# Patient Record
Sex: Male | Born: 1965 | Hispanic: Yes | Marital: Married | Smoking: Never smoker
Health system: Southern US, Community
[De-identification: ages and names within clinical notes are randomized; demographics above are authoritative.]

## PROBLEM LIST (undated history)

## (undated) DIAGNOSIS — F32A Depression, unspecified: Secondary | ICD-10-CM

## (undated) DIAGNOSIS — F329 Major depressive disorder, single episode, unspecified: Secondary | ICD-10-CM

---
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
## 2005-07-19 ENCOUNTER — Emergency Department: Payer: Self-pay | Admitting: Emergency Medicine

## 2007-12-26 ENCOUNTER — Emergency Department: Payer: Self-pay | Admitting: Emergency Medicine

## 2013-09-17 ENCOUNTER — Emergency Department: Payer: Self-pay | Admitting: Internal Medicine

## 2019-08-31 ENCOUNTER — Emergency Department: Payer: Self-pay

## 2019-08-31 ENCOUNTER — Other Ambulatory Visit: Payer: Self-pay

## 2019-08-31 ENCOUNTER — Encounter: Payer: Self-pay | Admitting: Emergency Medicine

## 2019-08-31 ENCOUNTER — Emergency Department
Admission: EM | Admit: 2019-08-31 | Discharge: 2019-08-31 | Disposition: A | Discharge status: Home / Self Care | Payer: Self-pay | Attending: Emergency Medicine | Admitting: Emergency Medicine

## 2019-08-31 DIAGNOSIS — Z20822 Contact with and (suspected) exposure to covid-19: Secondary | ICD-10-CM | POA: Insufficient documentation

## 2019-08-31 DIAGNOSIS — J189 Pneumonia, unspecified organism: Secondary | ICD-10-CM | POA: Insufficient documentation

## 2019-08-31 DIAGNOSIS — R042 Hemoptysis: Secondary | ICD-10-CM

## 2019-08-31 HISTORY — DX: Depression, unspecified: F32.A

## 2019-08-31 LAB — CBC
HCT: 46.3 % (ref 39.0–52.0)
Hemoglobin: 15.5 g/dL (ref 13.0–17.0)
MCH: 29.4 pg (ref 26.0–34.0)
MCHC: 33.5 g/dL (ref 30.0–36.0)
MCV: 87.7 fL (ref 80.0–100.0)
Platelets: 209 10*3/uL (ref 150–400)
RBC: 5.28 MIL/uL (ref 4.22–5.81)
RDW: 13.5 % (ref 11.5–15.5)
WBC: 11.7 10*3/uL — ABNORMAL HIGH (ref 4.0–10.5)
nRBC: 0 % (ref 0.0–0.2)

## 2019-08-31 LAB — BASIC METABOLIC PANEL
Anion gap: 11 (ref 5–15)
BUN: 16 mg/dL (ref 6–20)
CO2: 22 mmol/L (ref 22–32)
Calcium: 8.8 mg/dL — ABNORMAL LOW (ref 8.9–10.3)
Chloride: 103 mmol/L (ref 98–111)
Creatinine, Ser: 0.71 mg/dL (ref 0.61–1.24)
GFR calc Af Amer: >60 mL/min (ref >60)
GFR calc non Af Amer: >60 mL/min (ref >60)
Glucose, Bld: 129 mg/dL — ABNORMAL HIGH (ref 70–99)
Potassium: 4.1 mmol/L (ref 3.5–5.1)
Sodium: 136 mmol/L (ref 135–145)

## 2019-08-31 LAB — PROTIME-INR
INR: 1 (ref 0.8–1.2)
Prothrombin Time: 12.7 seconds (ref 11.4–15.2)

## 2019-08-31 LAB — POC SARS CORONAVIRUS 2 AG: SARS Coronavirus 2 Ag: NEGATIVE

## 2019-08-31 MED ORDER — LEVOFLOXACIN 750 MG PO TABS: 750.0000 mg | ORAL_TABLET | Freq: Every day | ORAL | 0 refills | Status: AC

## 2019-08-31 MED ORDER — LEVOFLOXACIN 750 MG PO TABS
750.0000 mg | ORAL_TABLET | Freq: Once | ORAL | Status: AC
  Administered 2019-08-31: 750 mg via ORAL
  Filled 2019-08-31: qty 1

## 2019-08-31 MED ORDER — IOHEXOL 350 MG/ML SOLN
75.0000 mL | Freq: Once | INTRAVENOUS | Status: AC | PRN
  Administered 2019-08-31: 75 mL via INTRAVENOUS

## 2019-08-31 MED ORDER — PREDNISONE 20 MG PO TABS
60.0000 mg | ORAL_TABLET | Freq: Once | ORAL | Status: AC
  Administered 2019-08-31: 60 mg via ORAL
  Filled 2019-08-31: qty 3

## 2019-08-31 MED ORDER — METHYLPREDNISOLONE 4 MG PO TBPK: ORAL_TABLET | ORAL | 0 refills | Status: AC

## 2019-08-31 NOTE — ED Provider Notes (Signed)
[facility] Emergency Department Provider Note       Time seen: ----------------------------------------- 10:39 AM on 08/31/2019 -----------------------------------------   I have reviewed the triage vital signs and the nursing notes.  HISTORY   Chief Complaint Hemoptysis    HPI Evan Drake is a 54 y.o. male with a history of depression and hyperlipidemia who presents to the ED for sudden onset of hemoptysis.  Patient reports that started acutely this morning at 2 AM and recurred again at 9 AM.  He reports having some phlegm with obvious blood in it.  He has not had any recent sickness, denies any other complaints or pain.  Past Medical History:  Diagnosis Date  . Depression     There are no problems to display for this patient.   History reviewed. No pertinent surgical history.  Allergies Patient has no known allergies.  Social History Social History   Tobacco Use  . Smoking status: Never Smoker  . Smokeless tobacco: Never Used  Substance Use Topics  . Alcohol use: Never  . Drug use: Never    Review of Systems Constitutional: Negative for fever. Cardiovascular: Negative for chest pain. Respiratory: Negative for shortness of breath.  Positive for hemoptysis Gastrointestinal: Negative for abdominal pain, vomiting and diarrhea. Musculoskeletal: Negative for back pain. Skin: Negative for rash. Neurological: Negative for headaches, focal weakness or numbness.  All systems negative/normal/unremarkable except as stated in the HPI  ____________________________________________   PHYSICAL EXAM:  VITAL SIGNS: ED Triage Vitals  Enc Vitals Group     BP 08/31/19 0927 (!) 141/84     Pulse Rate 08/31/19 0927 93     Resp 08/31/19 0927 16     Temp 08/31/19 0927 99.4 F (37.4 C)     Temp Source 08/31/19 0927 Oral     SpO2 08/31/19 0927 98 %     Weight 08/31/19 0928 195 lb (88.5 kg)     Height 08/31/19 0928 5' 8.9" (1.75 m)   Head Circumference --      Peak Flow --      Pain Score 08/31/19 0927 0     Pain Loc --      Pain Edu? --      Excl. in GC? --     Constitutional: Alert and oriented. Well appearing and in no distress. Eyes: Conjunctivae are normal. Normal extraocular movements. ENT      Head: Normocephalic and atraumatic.      Nose: No congestion/rhinnorhea.      Mouth/Throat: Mucous membranes are moist.      Neck: No stridor. Cardiovascular: Normal rate, regular rhythm. No murmurs, rubs, or gallops. Respiratory: Normal respiratory effort without tachypnea nor retractions. Breath sounds are clear and equal bilaterally. No wheezes/rales/rhonchi. Gastrointestinal: Soft and nontender. Normal bowel sounds Musculoskeletal: Nontender with normal range of motion in extremities. No lower extremity tenderness nor edema. Neurologic:  Normal speech and language. No gross focal neurologic deficits are appreciated.  Skin:  Skin is warm, dry and intact. No rash noted. Psychiatric: Mood and affect are normal. Speech and behavior are normal.  ____________________________________________  ED COURSE:  As part of my medical decision making, I reviewed the following data within the electronic MEDICAL RECORD NUMBER History obtained from family if available, nursing notes, old chart and ekg, as well as notes from prior ED visits. Patient presented for hemoptysis, we will assess with labs and imaging as indicated at this time.   Procedures  Evan Drake was evaluated in Emergency Department  on 08/31/2019 for the symptoms described in the history of present illness. He was evaluated in the context of the global COVID-19 pandemic, which necessitated consideration that the patient might be at risk for infection with the SARS-CoV-2 virus that causes COVID-19. Institutional protocols and algorithms that pertain to the evaluation of patients at risk for COVID-19 are in a state of rapid change based on information released by  regulatory bodies including the CDC and federal and state organizations. These policies and algorithms were followed during the patient's care in the ED.  ____________________________________________   LABS (pertinent positives/negatives)  Labs Reviewed  CBC - Abnormal; Notable for the following components:      Result Value   WBC 11.7 (*)    All other components within normal limits  BASIC METABOLIC PANEL - Abnormal; Notable for the following components:   Glucose, Bld 129 (*)    Calcium 8.8 (*)    All other components within normal limits  PROTIME-INR  POC SARS CORONAVIRUS 2 AG  POC SARS CORONAVIRUS 2 AG -  ED    RADIOLOGY Images were viewed by me  Chest x-ray IMPRESSION: Subtle, streaky opacities at the right lung base, may represent atelectasis or early infiltrate. IMPRESSION: 1. Evaluation for pulmonary embolism is significantly limited by breath motion artifact, particularly in the bilateral lung bases. Within this limitation, there is no central or lobar pulmonary embolism. The segmental and more distal vessels cannot be adequately evaluated to exclude pulmonary embolism, particularly in the lung bases. 2. Dense, rounded subpleural consolidation in the right costophrenic recess, which is generally consistent with nonspecific infection or aspiration, although this appearance is also compatible with a subsegmental pulmonary infarction given reported history of hemoptysis. Consider technical repeat CT angiogram to further evaluate for pulmonary embolus if there is high clinical concern based on presentation. 3. Malignancy is likewise not strictly excluded. Recommend follow-up CT in 6-8 weeks to assess for resolution. Given location and subtlety on same day radiographs, radiographic assessment for resolution will likely prove insufficient.  ____________________________________________   DIFFERENTIAL DIAGNOSIS   Coagulopathy, thrombocytopenia, lung cancer, upper  GI bleeding,  FINAL ASSESSMENT AND PLAN  Hemoptysis, community-acquired pneumonia   Plan: The patient had presented for hemoptysis. Patient's labs do not reveal any acute process. Patient's imaging revealed a possible right lung base atelectasis or infiltrate.  CT is more indicative of pneumonia than any other etiology.  I had this reviewed by pulmonology, Dr. Raul Del who feels he can be safely discharged with Levaquin and Medrol Dosepak.  He will follow up with pulmonology as an outpatient.  He will also need repeat CT chest in 6 weeks.   Laurence Aly, MD    Note: This note was generated in part or whole with voice recognition software. Voice recognition is usually quite accurate but there are transcription errors that can and very often do occur. I apologize for any typographical errors that were not detected and corrected.     Earleen Newport, MD 08/31/19 [phone]

## 2019-08-31 NOTE — ED Triage Notes (Addendum)
Pt in via POV, reports hemoptysis since 0200 this morning, denies hx of same, denies blood thinners.  Vitals WDL, NAD noted at this time.

## 2020-05-22 ENCOUNTER — Telehealth: Payer: Self-pay

## 2020-05-22 DIAGNOSIS — Z87891 Personal history of nicotine dependence: Secondary | ICD-10-CM

## 2020-05-22 DIAGNOSIS — Z122 Encounter for screening for malignant neoplasm of respiratory organs: Secondary | ICD-10-CM

## 2020-05-22 NOTE — Telephone Encounter (Signed)
Received referral for initial lung cancer screening scan. Contacted patient and gave him information on what program consists of. Patient is agreeable to proceed with yearly CT scans. Smoking history obtained. Patient is a former smoker and quit smoking in March 2021.  He smoked about 2 packs a day for approximately 45 years. Patient denies any terminal illness/ comorbidities that would prevent curative treatment if lung cancer were found. Patient denies any cough, shortness of breath or any other lung issues. Patient request that the scan be scheduled on a Wednesday afternoon (after 3pm), due to his work schedule.  He wil wait for a call back with appt details.

## 2020-05-23 NOTE — Telephone Encounter (Signed)
Former smoker, quit 08/23/19, 90 pack year history.

## 2020-05-23 NOTE — Telephone Encounter (Signed)
Patient scheduled for CT scan on Wednesday 06/07/20 with arrival time of 415 pm at [facility]. Called pt and notified him of appt and also told him that he will be have a "shared decision making visit" with the asistance of an interpreter prior to CT. Patient is ok with this date and time. I will send him an appt reminder letter.

## 2020-05-23 NOTE — Addendum Note (Signed)
Addended by: Jonne Ply on: 05/23/2020 09:55 AM   Modules accepted: Orders

## 2020-06-07 ENCOUNTER — Other Ambulatory Visit: Payer: Self-pay

## 2020-06-07 ENCOUNTER — Inpatient Hospital Stay: Payer: Self-pay | Attending: Oncology | Admitting: Oncology

## 2020-06-07 ENCOUNTER — Ambulatory Visit
Admission: RE | Admit: 2020-06-07 | Discharge: 2020-06-07 | Disposition: A | Discharge status: Home / Self Care | Payer: Self-pay | Source: Ambulatory Visit | Attending: Oncology | Admitting: Oncology

## 2020-06-07 DIAGNOSIS — Z122 Encounter for screening for malignant neoplasm of respiratory organs: Secondary | ICD-10-CM | POA: Insufficient documentation

## 2020-06-07 DIAGNOSIS — Z87891 Personal history of nicotine dependence: Secondary | ICD-10-CM | POA: Insufficient documentation

## 2020-06-07 NOTE — Progress Notes (Signed)
Virtual Visit via Video Note  I connected with Mr. Maxwell Caul on 06/07/20 at  4:15 PM EST by a video enabled telemedicine application and verified that I am speaking with the correct person using two identifiers.  Location: Patient: [facility] Provider: [facility]    I discussed the limitations of evaluation and management by telemedicine and the availability of in person appointments. The patient expressed understanding and agreed to proceed.  I discussed the assessment and treatment plan with the patient. The patient was provided an opportunity to ask questions and all were answered. The patient agreed with the plan and demonstrated an understanding of the instructions.   The patient was advised to call back or seek an in-person evaluation if the symptoms worsen or if the condition fails to improve as anticipated.   In accordance with CMS guidelines, patient has met eligibility criteria including age, absence of signs or symptoms of lung cancer.  Social History   Tobacco Use  . Smoking status: Never Smoker  . Smokeless tobacco: Never Used  Vaping Use  . Vaping Use: Never used  Substance Use Topics  . Alcohol use: Never  . Drug use: Never      A shared decision-making session was conducted prior to the performance of CT scan. This includes one or more decision aids, includes benefits and harms of screening, follow-up diagnostic testing, over-diagnosis, false positive rate, and total radiation exposure.   Counseling on the importance of adherence to annual lung cancer LDCT screening, impact of co-morbidities, and ability or willingness to undergo diagnosis and treatment is imperative for compliance of the program.   Counseling on the importance of continued smoking cessation for former smokers; the importance of smoking cessation for current smokers, and information about tobacco cessation interventions have been given to patient including [place] and [employer] quit [place] programs.    Written order for lung cancer screening with LDCT has been given to the patient and any and all questions have been answered to the best of my abilities.    Yearly follow up will be coordinated by Burgess Estelle, Thoracic Navigator.  I provided 15 minutes of face-to-face video visit time during this encounter, and > 50% was spent counseling as documented under my assessment & plan.   Jacquelin Hawking, NP

## 2020-06-09 ENCOUNTER — Encounter: Payer: Self-pay | Admitting: *Deleted

## 2020-07-03 ENCOUNTER — Other Ambulatory Visit: Payer: Self-pay

## 2020-07-03 DIAGNOSIS — Z20822 Contact with and (suspected) exposure to covid-19: Secondary | ICD-10-CM

## 2020-07-04 ENCOUNTER — Telehealth: Payer: Self-pay

## 2020-07-04 NOTE — Telephone Encounter (Signed)
Helped patient create a mychart account with instructions.

## 2020-07-06 LAB — NOVEL CORONAVIRUS, NAA: SARS-CoV-2, NAA: DETECTED — AB

## 2020-07-06 LAB — SARS-COV-2, NAA 2 DAY TAT

## 2021-03-30 IMAGING — CT CT CHEST LUNG CANCER SCREENING LOW DOSE W/O CM
2 of 5 series · 15 of 40 positions shown, 18 images · non-contrast
Comparison: 08/31/2019 chest CT angiogram.

CLINICAL DATA: 54-year-old asymptomatic male former smoker with 90
pack-year smoking history, quit within the prior year.

EXAM:
CT CHEST WITHOUT CONTRAST LOW-DOSE FOR LUNG CANCER SCREENING
TECHNIQUE: Multidetector CT imaging of the chest was performed following the
standard protocol without IV contrast.

[Series 3: lung 1.00 · axial · 0.81mm/px · z∈[-1195,-893]mm · 12 of 334 slices shown, 15 images]
[im 16/334  mediastinal]
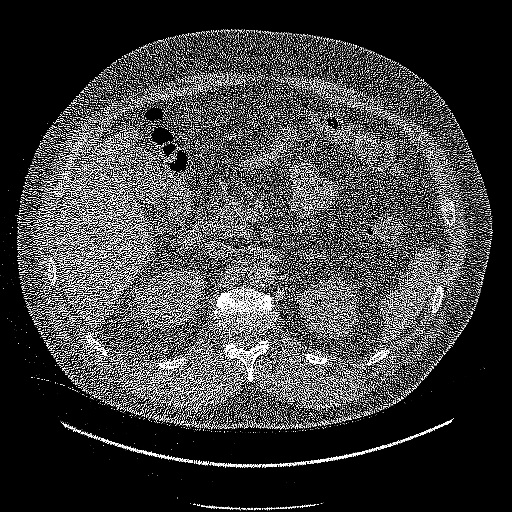
[im 16/334  lung]
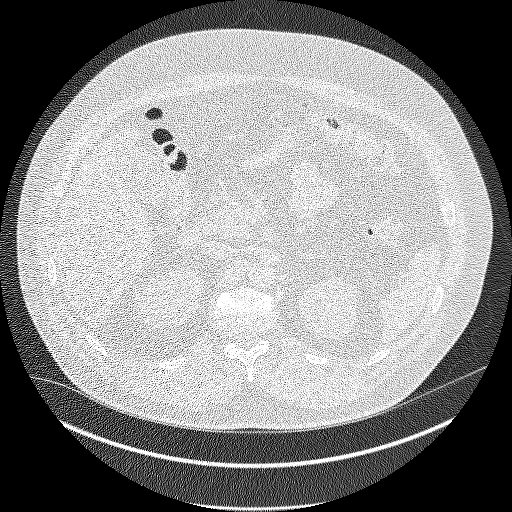
[im 46/334  lung]
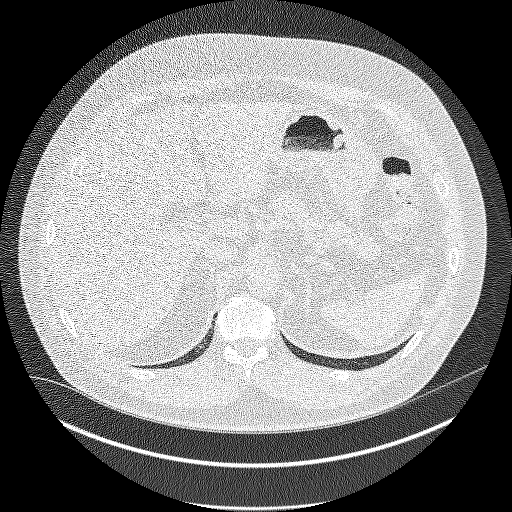
[im 76/334  lung]
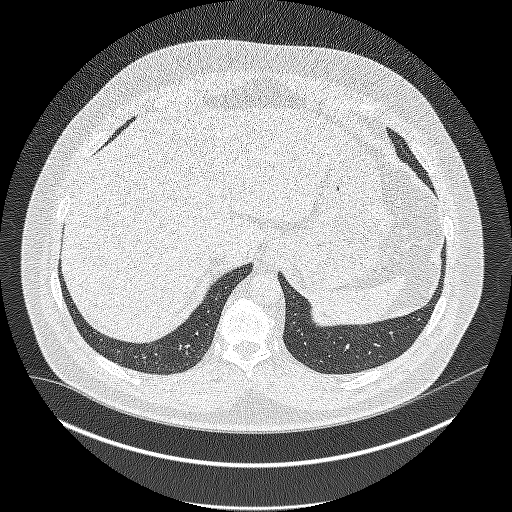
[im 106/334  lung]
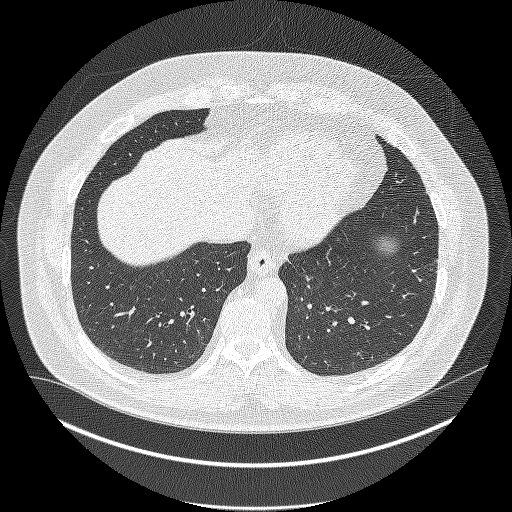
[im 122/334  mediastinal]
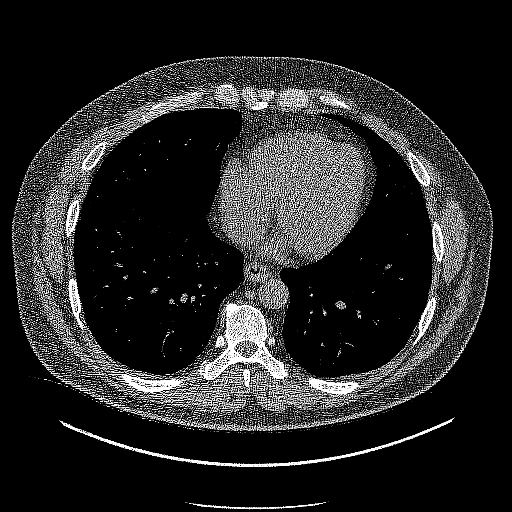
[im 122/334  lung]
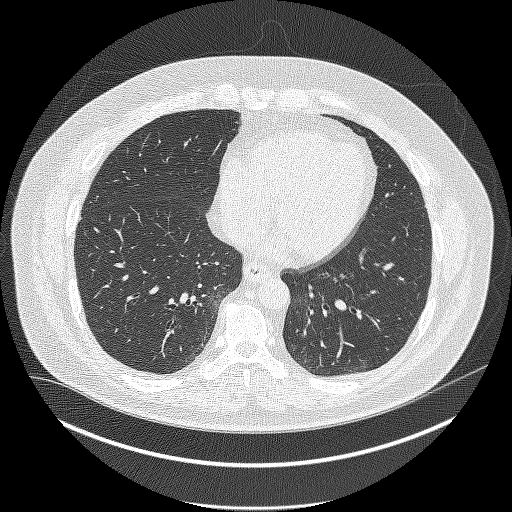
[im 152/334  lung]
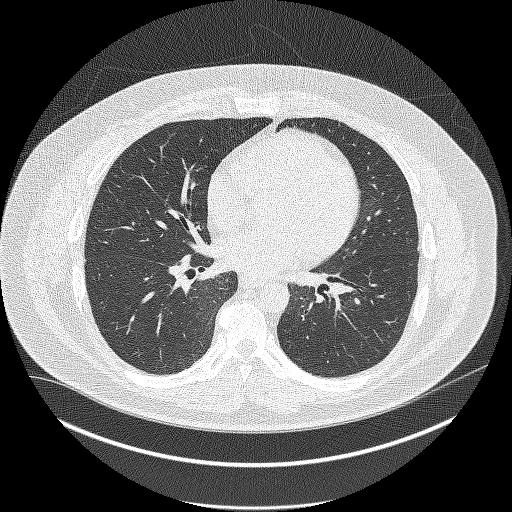
[im 182/334  lung]
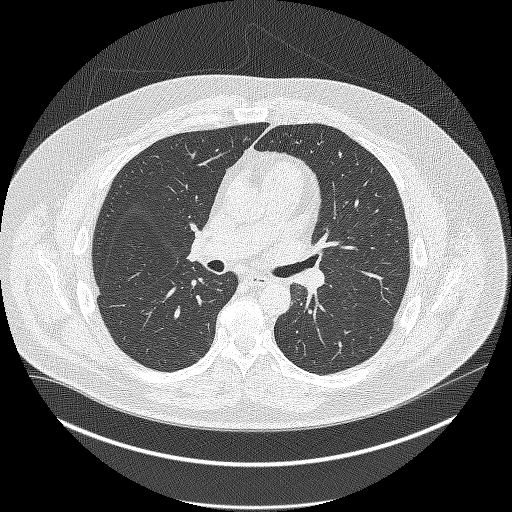
[im 212/334  lung]
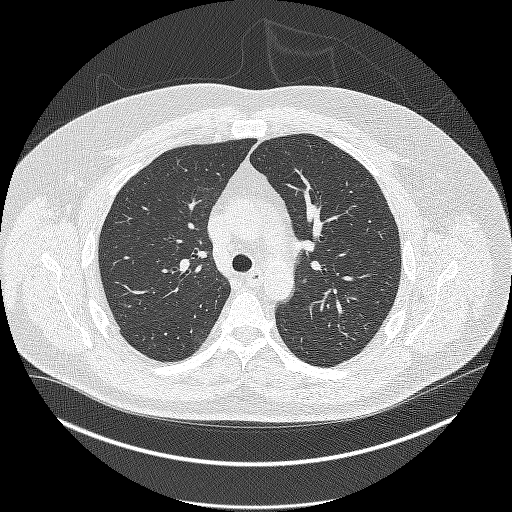
[im 228/334  mediastinal]
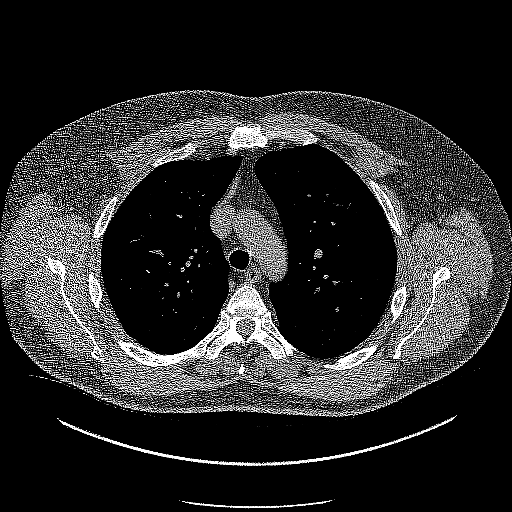
[im 228/334  lung]
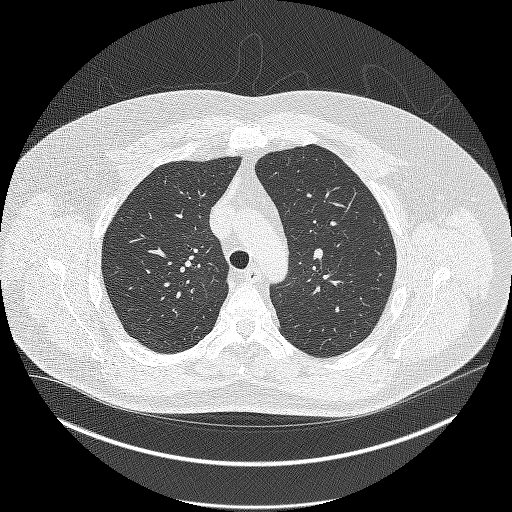
[im 258/334  lung]
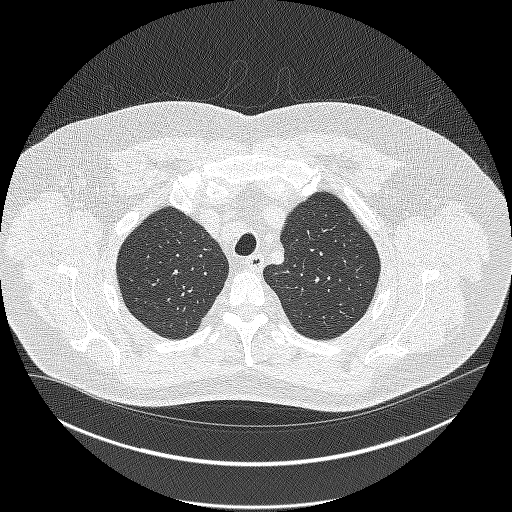
[im 288/334  lung]
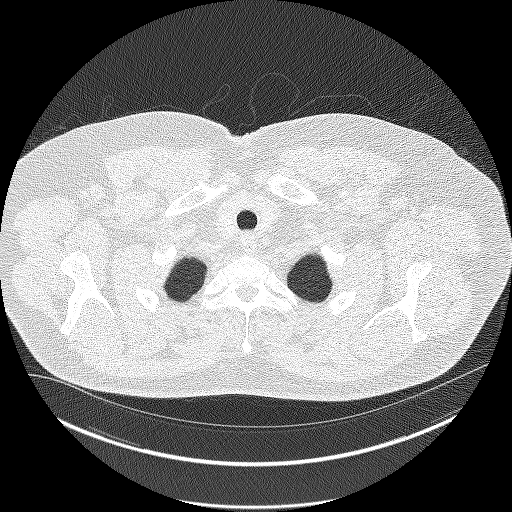
[im 318/334  lung]
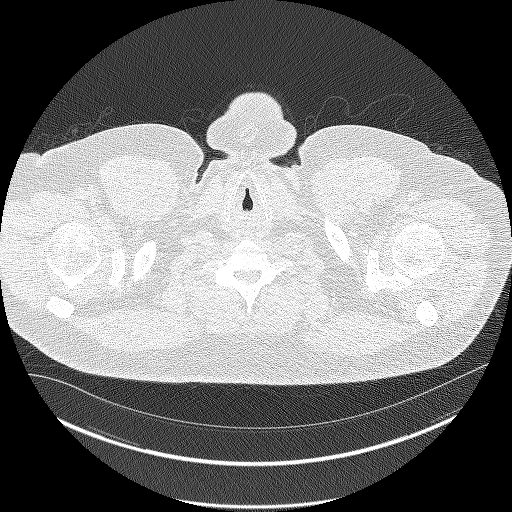

[Series 4: coronals lung 1.00 cor · coronal · 0.65mm/px · 3 of 373 slices shown]
[im 75/373  lung]
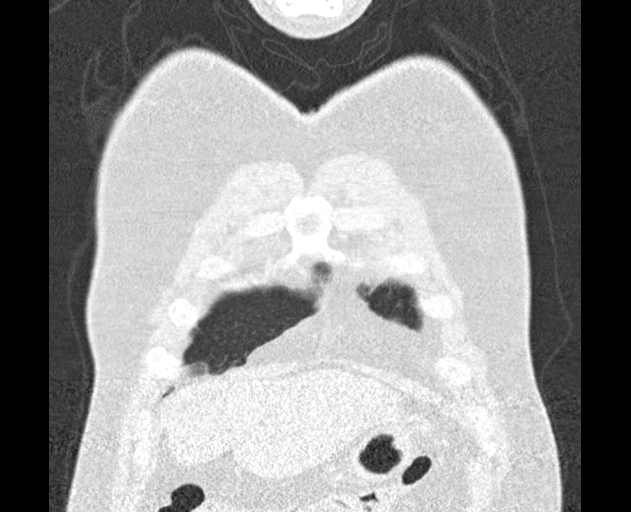
[im 149/373  lung]
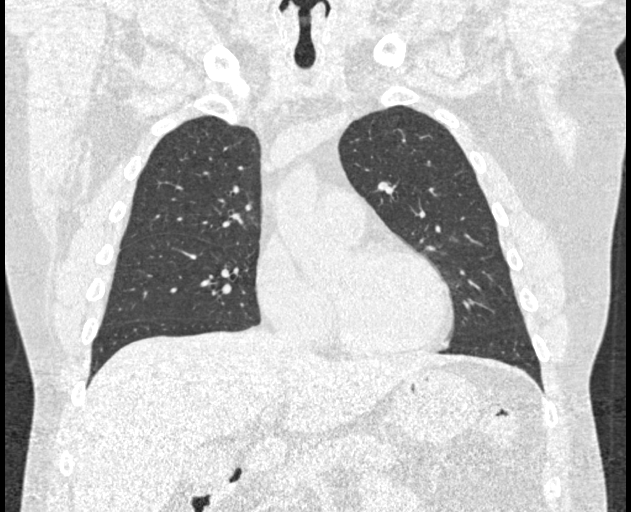
[im 224/373  lung]
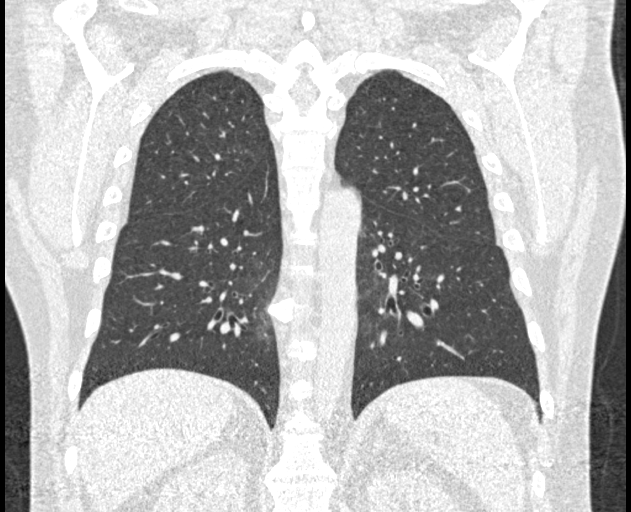

[15 of 40 positions shown; findings below may reference images not displayed]

FINDINGS: Cardiovascular: Normal heart size. No significant pericardial
effusion/thickening. Great vessels are normal in course and caliber.

Mediastinum/Nodes: No discrete thyroid nodules. Unremarkable
esophagus. No pathologically enlarged axillary, mediastinal or hilar
lymph nodes, noting limited sensitivity for the detection of hilar
adenopathy on this noncontrast study.

Lungs/Pleura: No pneumothorax. No pleural effusion. No acute
consolidative airspace disease or lung masses. Mild centrilobular
emphysema. Tiny right lower lobe solid pulmonary nodule measuring
2.8 mm in volume derived mean diameter (series 3/image 225). No
additional significant pulmonary nodules.

Upper abdomen: No acute abnormality.

Musculoskeletal: No aggressive appearing focal osseous lesions. Mild
thoracic spondylosis.
IMPRESSION: 1. Lung-RADS 2, benign appearance or behavior. Continue annual
screening with low-dose chest CT without contrast in 12 months.
2. Emphysema ([id number]).

## 2021-06-26 ENCOUNTER — Other Ambulatory Visit: Payer: Self-pay | Admitting: *Deleted

## 2021-06-26 DIAGNOSIS — Z87891 Personal history of nicotine dependence: Secondary | ICD-10-CM

## 2021-07-09 ENCOUNTER — Ambulatory Visit: Payer: Self-pay | Attending: Acute Care

## 2023-10-13 ENCOUNTER — Emergency Department: Payer: Self-pay

## 2023-10-13 ENCOUNTER — Emergency Department
Admission: EM | Admit: 2023-10-13 | Discharge: 2023-10-13 | Disposition: A | Discharge status: Home / Self Care | Payer: Self-pay | Attending: Emergency Medicine | Admitting: Emergency Medicine

## 2023-10-13 ENCOUNTER — Other Ambulatory Visit: Payer: Self-pay

## 2023-10-13 DIAGNOSIS — H7092 Unspecified mastoiditis, left ear: Secondary | ICD-10-CM | POA: Insufficient documentation

## 2023-10-13 LAB — CBC WITH DIFFERENTIAL/PLATELET
Abs Immature Granulocytes: 0.02 10*3/uL (ref 0.00–0.07)
Basophils Absolute: 0 10*3/uL (ref 0.0–0.1)
Basophils Relative: 0 %
Eosinophils Absolute: 0.1 10*3/uL (ref 0.0–0.5)
Eosinophils Relative: 1 %
HCT: 46.9 % (ref 39.0–52.0)
Hemoglobin: 15.7 g/dL (ref 13.0–17.0)
Immature Granulocytes: 0 %
Lymphocytes Relative: 43 %
Lymphs Abs: 3.6 10*3/uL (ref 0.7–4.0)
MCH: 29 pg (ref 26.0–34.0)
MCHC: 33.5 g/dL (ref 30.0–36.0)
MCV: 86.7 fL (ref 80.0–100.0)
Monocytes Absolute: 0.5 10*3/uL (ref 0.1–1.0)
Monocytes Relative: 6 %
Neutro Abs: 4.3 10*3/uL (ref 1.7–7.7)
Neutrophils Relative %: 50 %
Platelets: 266 10*3/uL (ref 150–400)
RBC: 5.41 MIL/uL (ref 4.22–5.81)
RDW: 13.1 % (ref 11.5–15.5)
WBC: 8.5 10*3/uL (ref 4.0–10.5)
nRBC: 0 % (ref 0.0–0.2)

## 2023-10-13 LAB — BASIC METABOLIC PANEL WITH GFR
Anion gap: 9 (ref 5–15)
BUN: 17 mg/dL (ref 6–20)
CO2: 26 mmol/L (ref 22–32)
Calcium: 9.3 mg/dL (ref 8.9–10.3)
Chloride: 100 mmol/L (ref 98–111)
Creatinine, Ser: 0.95 mg/dL (ref 0.61–1.24)
GFR, Estimated: >60 mL/min (ref >60)
Glucose, Bld: 101 mg/dL — ABNORMAL HIGH (ref 70–99)
Potassium: 4 mmol/L (ref 3.5–5.1)
Sodium: 135 mmol/L (ref 135–145)

## 2023-10-13 MED ORDER — IOHEXOL 300 MG/ML  SOLN
80.0000 mL | Freq: Once | INTRAMUSCULAR | Status: AC | PRN
  Administered 2023-10-13: 80 mL via INTRAVENOUS

## 2023-10-13 MED ORDER — CLINDAMYCIN HCL 300 MG PO CAPS: 600.0000 mg | ORAL_CAPSULE | Freq: Three times a day (TID) | ORAL | 0 refills | Status: AC

## 2023-10-13 MED ORDER — OXYCODONE-ACETAMINOPHEN 5-325 MG PO TABS
1.0000 | ORAL_TABLET | Freq: Once | ORAL | Status: AC
  Administered 2023-10-13: 1 via ORAL
  Filled 2023-10-13: qty 1

## 2023-10-13 NOTE — ED Triage Notes (Signed)
 Pt comes with left ear pain since December. Pt states he came today bc the pain is worse. Pt states he doesn't like hospital.   Pt states headache all over. Pt states trouble sleeping because of this. Pt states he can't hear out of his ear either.

## 2023-10-13 NOTE — ED Notes (Signed)
 Accompanied Dr. Cleora Daft in room to evaluate pt. Pt is CAOx4, breathing normally, and normal in color. Pt is sitting upright in a chair as you enter the room. Pt is complaining of left ear pain (10/10) and jaw pain for several months (since December) and hearing loss in same . Pt also complains of hearing loss in right hear as well. Pt advises he has had a few fevers since the pain started, but fevers go away with medicine. Pt advised he has been seen for this issue, but he still has hearing problems as well as unbearable pain.

## 2023-10-13 NOTE — ED Notes (Signed)
 Pt advised he is in 0/10 pain at this time and expresses this is the first time he has been pain free and able to sleep in a long time.

## 2023-10-13 NOTE — ED Provider Notes (Signed)
 [facility] Provider Note    Event Date/Time   First MD Initiated Contact with Patient 10/13/23 1955     (approximate)   History   Chief Complaint Otalgia   HPI  Evan Drake is a 58 y.o. male with no significant past medical history who presents to the ED complaining of ear pain.  History is limited as patient is Spanish-speaking only, history obtained via interpreter [phone].  Patient reports that he has been dealing with increasing pain in his left ear for the past 4 months.  He states he was at work in December when he felt a pop in his left ear, since then has been unable to hear out of the ear and reports increased pain and swelling, especially behind the ear.  He reports being seen by his PCP in the [facility] system and was referred to ENT, but appointment is not for another month and pain has become unbearable in that time.  He reports sensitivity to touch behind the ear and extending down to the angle of his mandible.  He reports the skin of his ear also seems sensitive to touch.  He denies any fevers and has not had any drainage from around the ear.      Physical Exam   Triage Vital Signs: ED Triage Vitals  Encounter Vitals Group     BP 10/13/23 1729 (!) 144/90     Systolic BP Percentile --      Diastolic BP Percentile --      Pulse Rate 10/13/23 1729 86     Resp 10/13/23 1729 18     Temp 10/13/23 1729 98 F (36.7 C)     Temp src --      SpO2 10/13/23 1729 97 %     Weight --      Height --      Head Circumference --      Peak Flow --      Pain Score 10/13/23 1727 10     Pain Loc --      Pain Education --      Exclude from Growth Chart --     Most recent vital signs: Vitals:   10/13/23 1729 10/13/23 2048  BP: (!) 144/90 124/78  Pulse: 86 72  Resp: 18 18  Temp: 98 F (36.7 C)   SpO2: 97% 98%    Constitutional: Alert and oriented. Eyes: Conjunctivae are normal. Head: Atraumatic. Ears: Right TM normal.  Left TM difficult to  visualize but appears ruptured.  Erythema and edema noted over left mastoid process with associated tenderness to palpation extending along posterior portion of the auricle. Nose: No congestion/rhinnorhea. Mouth/Throat: Mucous membranes are moist.  Cardiovascular: Normal rate, regular rhythm. Grossly normal heart sounds.  2+ radial pulses bilaterally. Respiratory: Normal respiratory effort.  No retractions. Lungs CTAB. Gastrointestinal: Soft and nontender. No distention. Musculoskeletal: No lower extremity tenderness nor edema.  Neurologic:  Normal speech and language. No gross focal neurologic deficits are appreciated.    ED Results / Procedures / Treatments   Labs (all labs ordered are listed, but only abnormal results are displayed) Labs Reviewed  BASIC METABOLIC PANEL WITH GFR - Abnormal; Notable for the following components:      Result Value   Glucose, Bld 101 (*)    All other components within normal limits  CBC WITH DIFFERENTIAL/PLATELET     RADIOLOGY CT temporal bones reviewed and interpreted by me with edema and inflammation over the mastoid process, no  abscess noted.  PROCEDURES:  Critical Care performed: No  Procedures   MEDICATIONS ORDERED IN ED: Medications  oxyCODONE -acetaminophen  (PERCOCET/ROXICET) 5-325 MG per tablet 1 tablet (1 tablet Oral Given 10/13/23 2036)  iohexol  (OMNIPAQUE ) 300 MG/ML solution 80 mL (80 mLs Intravenous Contrast Given 10/13/23 2127)     IMPRESSION / MDM / ASSESSMENT AND PLAN / ED COURSE  I reviewed the triage vital signs and the nursing notes.                              58 y.o. male with no significant past medical history presents to the ED with 4 months of increasing pain and swelling around his left ear with associated hearing loss.  Patient's presentation is most consistent with acute presentation with potential threat to life or bodily function.  Differential diagnosis includes, but is not limited to, mastoiditis, otitis  media, otitis externa, malignancy.  Patient nontoxic-appearing and in no acute distress, vital signs are unremarkable.  He has erythema, edema, and tenderness over his left mastoid process and extending towards the auricle but with no obvious fluctuance.  Presentation concerning for mastoiditis and we will further assess with CT imaging of his temporal bones.  Lab results are pending at this time, will treat symptomatically with Percocet and reassess following imaging.  Labs without significant anemia, leukocytosis, electrolyte abnormality, or AKI.  CT temporal bone result is pending at this time, but patient is stating that he needs to leave immediately to help care for his special needs grandchild.  I explained the risks of leaving to him given I am concerned that he has mastoiditis, he expresses understanding but states there is no one else to care for the child as his daughter has to go to work.  He expresses understanding of the risks and still wishes to leave the hospital AGAINST MEDICAL ADVICE.  Will prescribe antibiotics and refer to ENT, however he assures me that he will return to the hospital tomorrow for reassessment.      FINAL CLINICAL IMPRESSION(S) / ED DIAGNOSES   Final diagnoses:  Mastoiditis of left side     Rx / DC Orders   ED Discharge Orders          Ordered    clindamycin  (CLEOCIN ) 300 MG capsule  3 times daily        10/13/23 2318             Note:  This document was prepared using Dragon voice recognition software and may include unintentional dictation errors.   Twilla Galea, MD 10/13/23 2320

## 2023-10-13 NOTE — ED Notes (Signed)
 Pt expressed the need to be discharged due to circumstances with child care. Dr. Cleora Daft made aware and went and spoke to pt. Pt agreed to come back tomorrow to finish work up.

## 2023-10-16 ENCOUNTER — Telehealth: Payer: Self-pay | Admitting: Emergency Medicine

## 2023-10-16 NOTE — Telephone Encounter (Signed)
-----------------------------------------   7:29 AM on 10/16/2023 ----------------------------------------- Patient seen in the ED 3 days ago for pain and swelling behind his left ear, left AGAINST MEDICAL ADVICE prior to CT results.  He was informed at that time that he needed to stay in the hospital for IV antibiotics, but refused due to childcare issues.  CT came back with otomastoiditis on the left side with dehiscence of the temporal bone, multiple attempts made to reach patient or family member over the past couple of days have been unsuccessful.  Will continue to attempt to reach patient to notify him of CT results and need to return to the hospital.
